# Patient Record
Sex: Female | Born: 1985
Health system: Southern US, Community
[De-identification: ages and names within clinical notes are randomized; demographics above are authoritative.]

---

## 2014-04-03 IMAGING — CR DG FOOT COMPLETE 3+V*R*
3 series · 3 of 3 positions shown · non-contrast
Comparison: None.

CLINICAL DATA: Right foot injury tonight,pulled medical equipment
NG right foot tonight swelling bruising medially and laterally,
initial evaluation

EXAM:
RIGHT FOOT COMPLETE - 3+ VIEW

[x foot ap right]
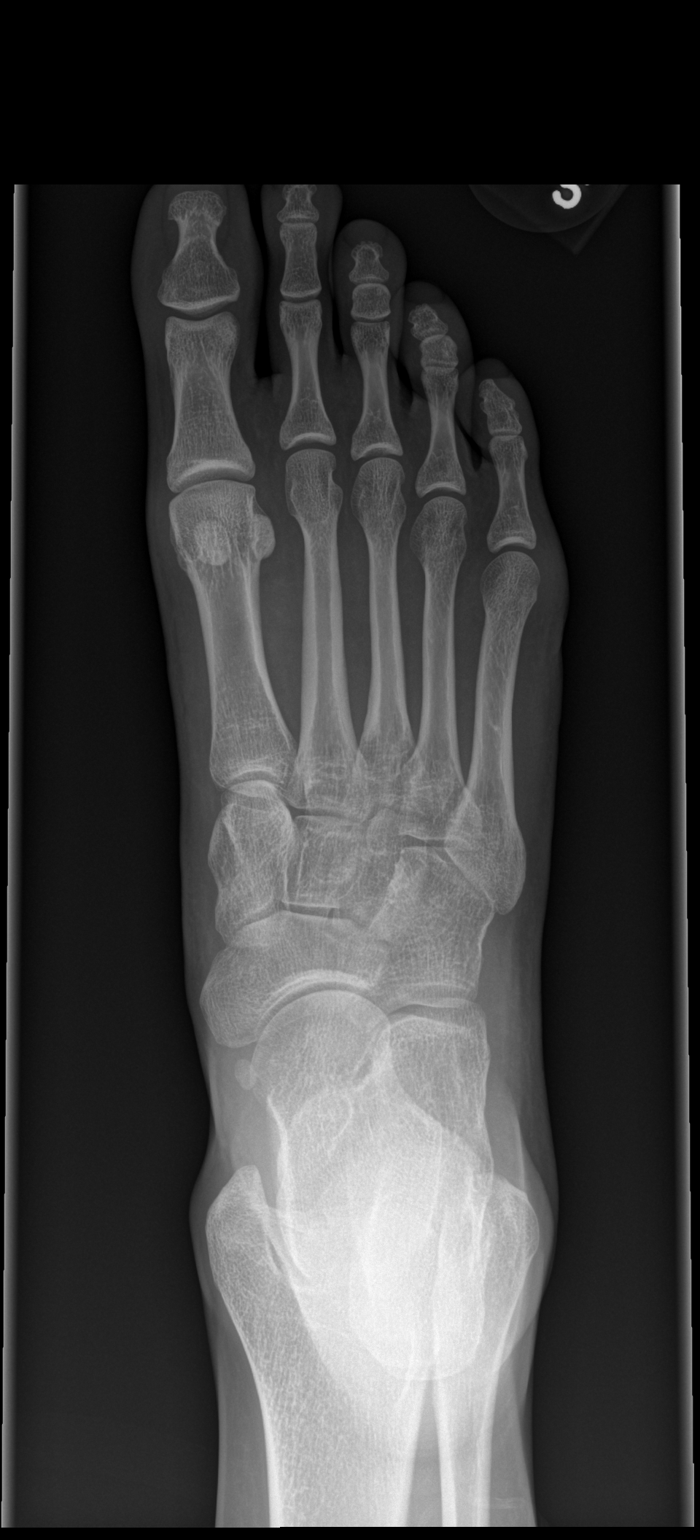

[x foot obl right]
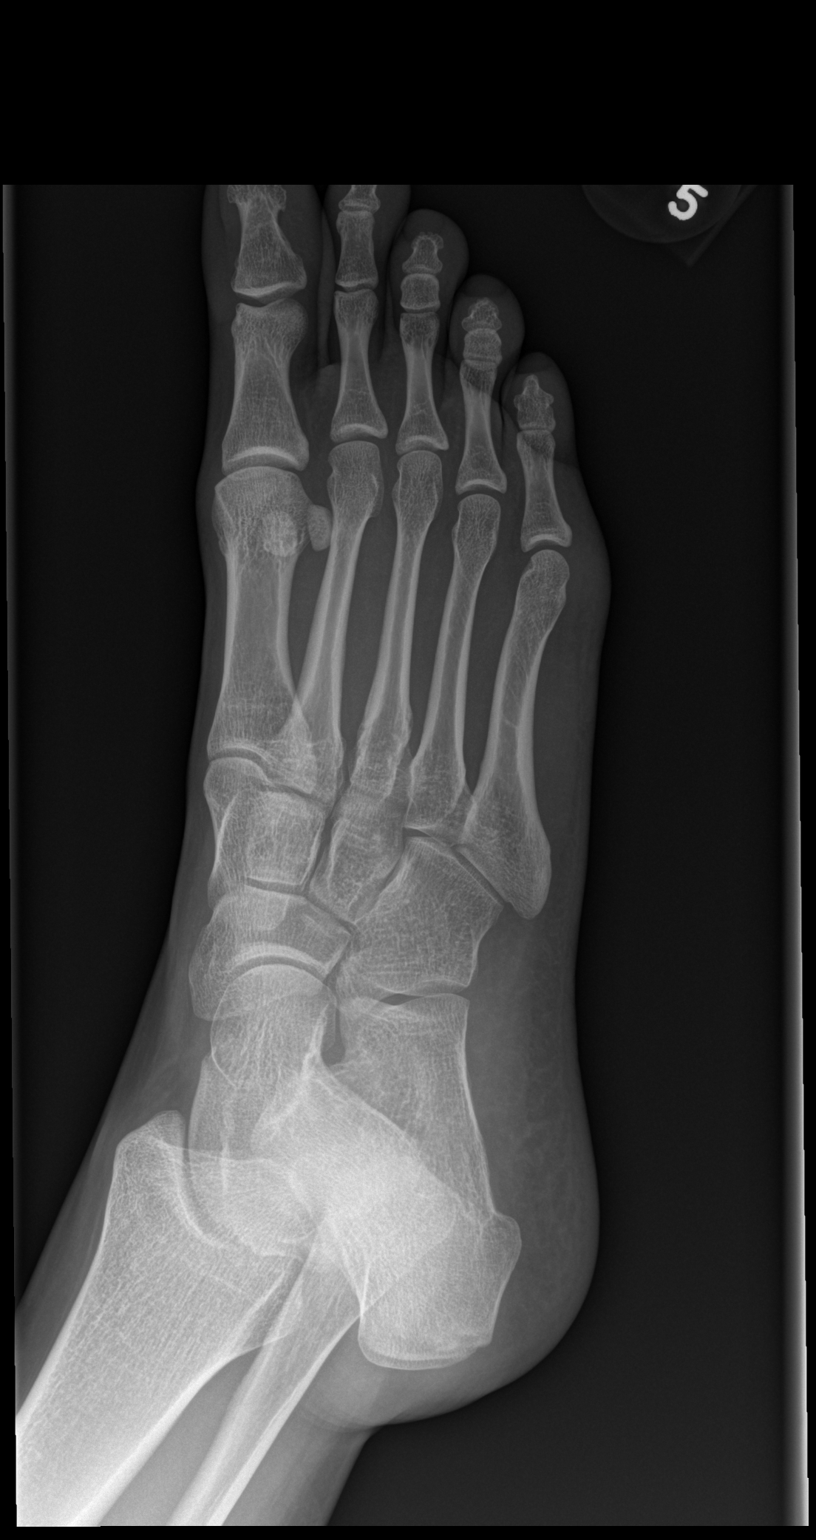

[x foot lat right]
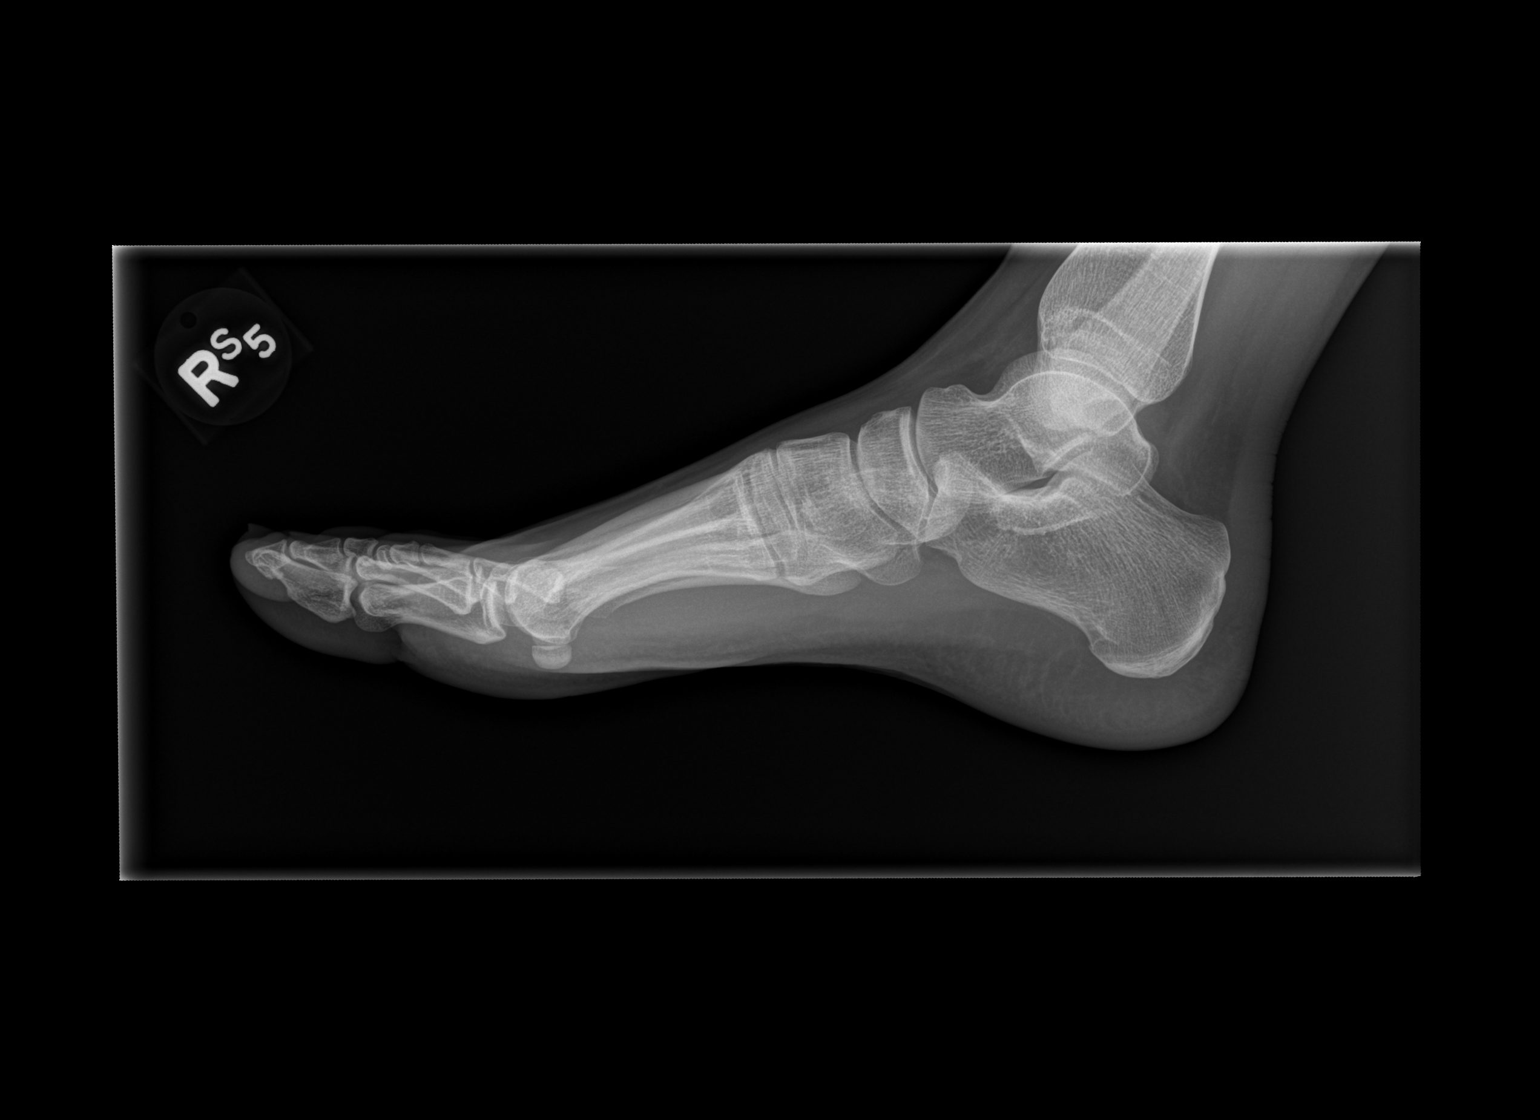

[3 of 3 positions shown; findings below may reference images not displayed]

FINDINGS: There is no evidence of fracture or dislocation. There is no
evidence of arthropathy or other focal bone abnormality. Soft
tissues are unremarkable.
IMPRESSION: Negative.

## 2015-07-05 MED FILL — NORGESTIMATE-ETH ESTRADIOL: 0.18/0.215/ | 28 days supply | Qty: 28 | Fill #6

## 2015-08-04 MED FILL — TRI-PREVIFEM TABLET: 0.18/0.215/ | 28 days supply | Qty: 28 | Fill #0

## 2015-09-05 MED FILL — TRI-PREVIFEM TABLET: 0.18/0.215/ | 28 days supply | Qty: 28 | Fill #1

## 2015-10-05 MED FILL — TRI-PREVIFEM TABLET: 0.18/0.215/ | 28 days supply | Qty: 28 | Fill #2

## 2015-11-08 MED FILL — TRI-PREVIFEM TABLET: 0.18/0.215/ | 28 days supply | Qty: 28 | Fill #0

## 2015-11-21 MED FILL — ESCITALOPRAM 10 MG TABLET: 10 | 30 days supply | Qty: 30 | Fill #0

## 2015-12-12 MED FILL — NORGESTIMATE-ETH ESTRADIOL: 0.18/0.215/ | 28 days supply | Qty: 28 | Fill #1

## 2016-01-09 MED FILL — NORG-EE 0.18-0.215-0.25/0.0: 0.18/0.215/ | 28 days supply | Qty: 28 | Fill #2

## 2016-02-03 MED FILL — NORG-EE 0.18-0.215-0.25/0.0: 0.18/0.215/ | 28 days supply | Qty: 28 | Fill #3

## 2016-03-02 MED FILL — NORG-EE 0.18-0.215-0.25/0.0: 0.18/0.215/ | 28 days supply | Qty: 28 | Fill #4

## 2016-03-08 IMAGING — CR DG CHEST 2V
2 series · 2 of 2 positions shown · non-contrast
Comparison: [DATE]

CLINICAL DATA: 30-year-old female with acute onset chest pain,
shortness of breath. Sensation of unable to take a normal breath on
the left side. No known injury. Initial encounter.

EXAM:
CHEST  2 VIEW

[w chest pa]
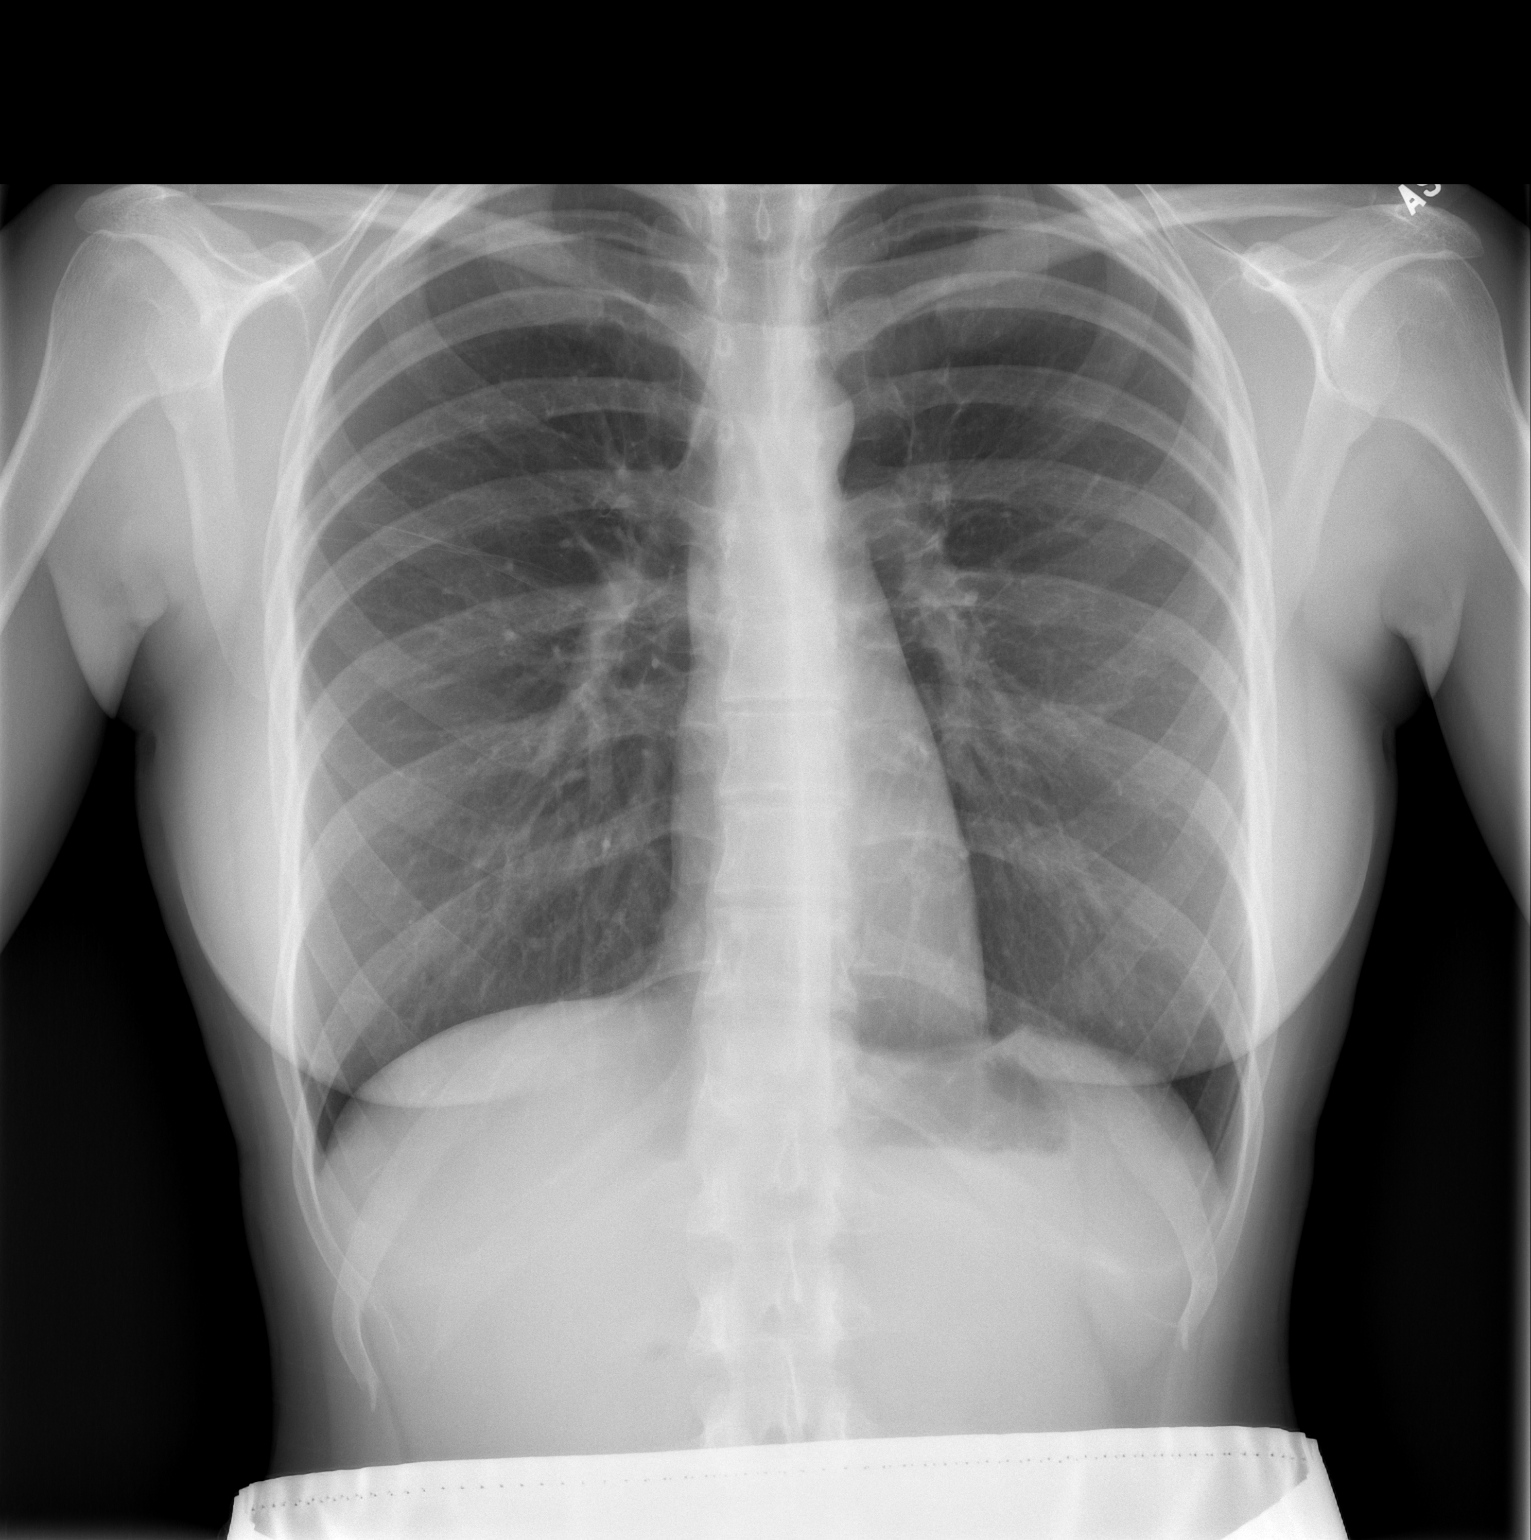

[w chest lat]
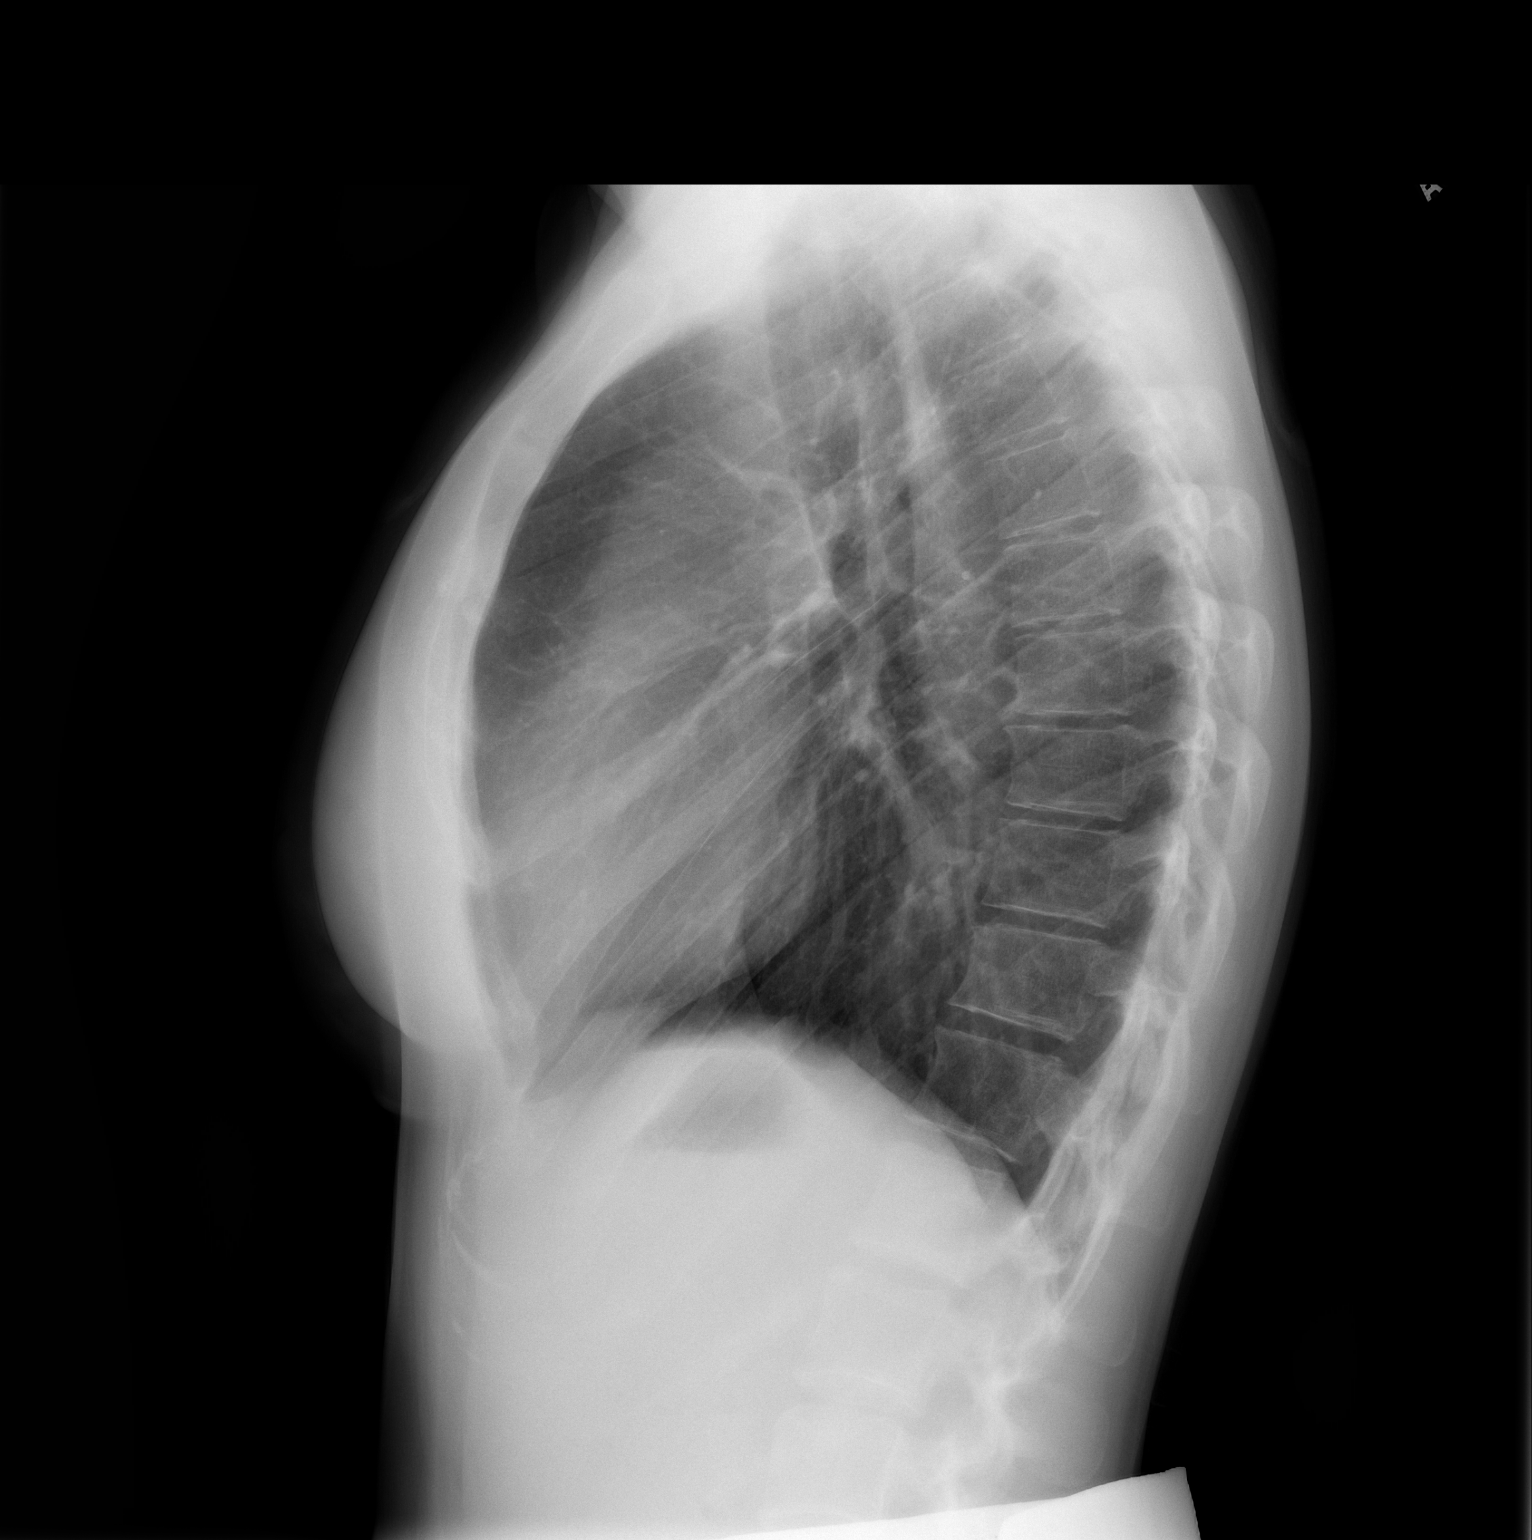

[2 of 2 positions shown; findings below may reference images not displayed]

FINDINGS: Stable lung volumes which are at the upper limits of normal to
mildly increased. Mediastinal contours are stable and within normal
limits. Visualized tracheal air column is within normal limits. No
osseous abnormality identified. Negative visible bowel gas pattern
in the upper abdomen.
IMPRESSION: Stable and negative.  No acute cardiopulmonary abnormality.

## 2016-04-03 MED FILL — NORG-EE 0.18-0.215-0.25/0.0: 0.18/0.215/ | 28 days supply | Qty: 28 | Fill #5

## 2016-05-03 MED FILL — NORG-EE 0.18-0.215-0.25/0.0: 0.18/0.215/ | 28 days supply | Qty: 28 | Fill #6

## 2016-05-28 MED FILL — NORG-EE 0.18-0.215-0.25/0.0: 0.18/0.215/ | 28 days supply | Qty: 28 | Fill #7

## 2016-07-06 MED FILL — NORG-EE 0.18-0.215-0.25/0.0: 0.18/0.215/ | 28 days supply | Qty: 28 | Fill #8

## 2016-08-07 MED FILL — NORG-EE 0.18-0.215-0.25/0.0: 0.18/0.215/ | 28 days supply | Qty: 28 | Fill #9

## 2016-09-12 MED FILL — NORG-EE 0.18-0.215-0.25/0.0: 0.18/0.215/ | 28 days supply | Qty: 28 | Fill #10

## 2016-10-22 MED FILL — NORG-EE 0.18-0.215-0.25/0.0: 0.18/0.215/ | 28 days supply | Qty: 28 | Fill #11

## 2019-07-16 IMAGING — MR MR HEAD WO/W CM
12 series · 48 of 48 positions shown · IV contrast (gadavist)
Comparison: Brain MRI [DATE].

CLINICAL DATA: 33-year-old female with abnormal finding on brain
MRI last [REDACTED]. Hand dysesthesia. Intermittent right hand pain and
numbness. Query progression of nonspecific white matter lesions.

EXAM:
MRI HEAD WITHOUT AND WITH CONTRAST
TECHNIQUE: Multiplanar, multiecho pulse sequences of the brain and surrounding
structures were obtained without and with intravenous contrast.
CONTRAST:  6mL GADAVIST GADOBUTROL 1 MMOL/ML IV SOLN

[Series 5: T1 · sagittal · 4.0mm · 0.75mm/px · 3 of 29 slices shown (1 of 2)]
[im 1/29]
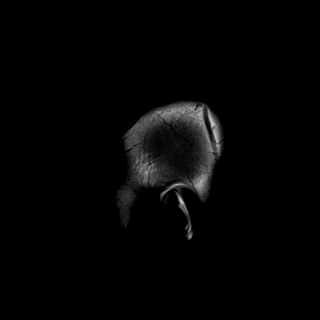
[im 15/29]
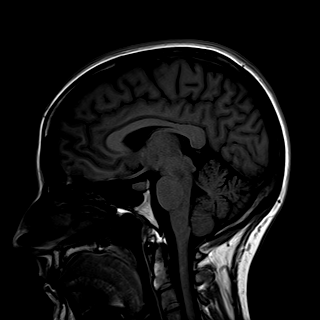
[im 29/29]
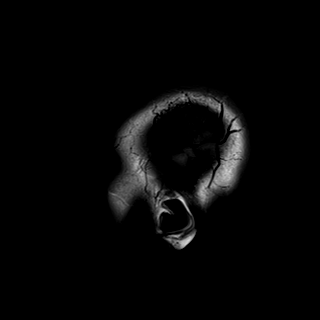

[Series 6: DWI · axial · 3.0mm · 1.44mm/px · z∈[-82,+67]mm · 7 of 92 slices shown (1 of 4)]
[im 1/92]
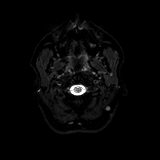
[im 16/92]
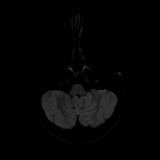
[im 31/92]
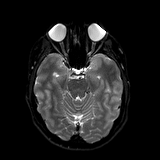
[im 46/92]
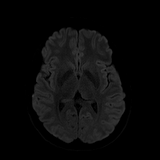
[im 61/92]
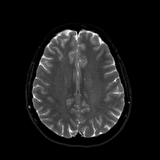
[im 76/92]
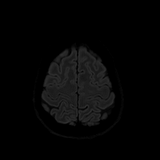
[im 92/92]
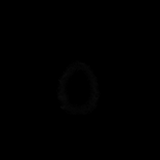

[Series 7: DWI · axial · 3.0mm · 1.44mm/px · z∈[-82,+67]mm · 3 of 45 slices shown (2 of 4)]
[im 1/45]
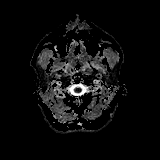
[im 23/45]
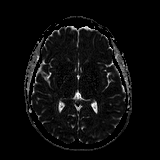
[im 45/45]
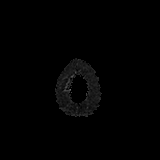

[Series 8: DWI · coronal · 5.0mm · 1.44mm/px · 5 of 64 slices shown (3 of 4)]
[im 1/64]
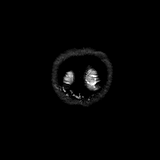
[im 16/64]
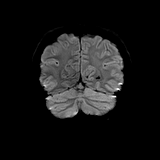
[im 32/64]
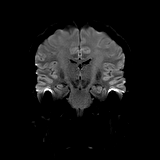
[im 48/64]
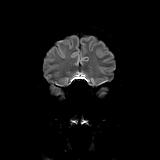
[im 64/64]
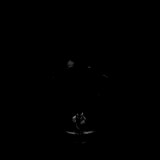

[Series 9: DWI · coronal · 5.0mm · 1.44mm/px · 2 of 32 slices shown (4 of 4)]
[im 1/32]
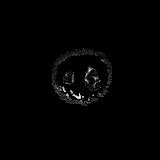
[im 32/32]
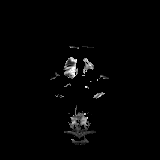

[Series 10: T2 · axial · 4.0mm · 0.36mm/px · z∈[-88,+68]mm · 2 of 31 slices shown]
[im 1/31]
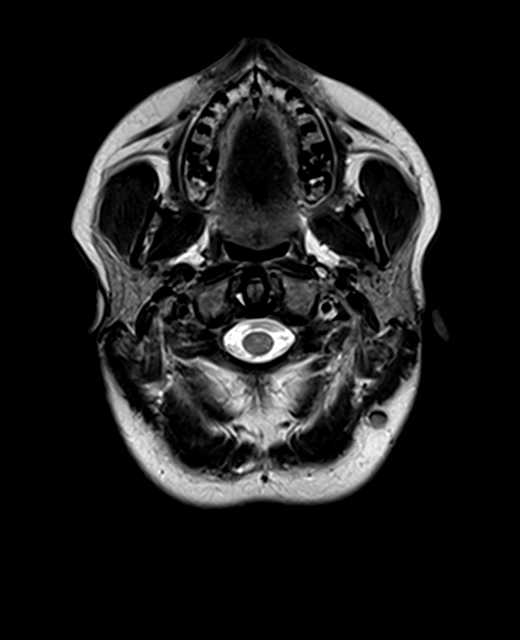
[im 31/31]
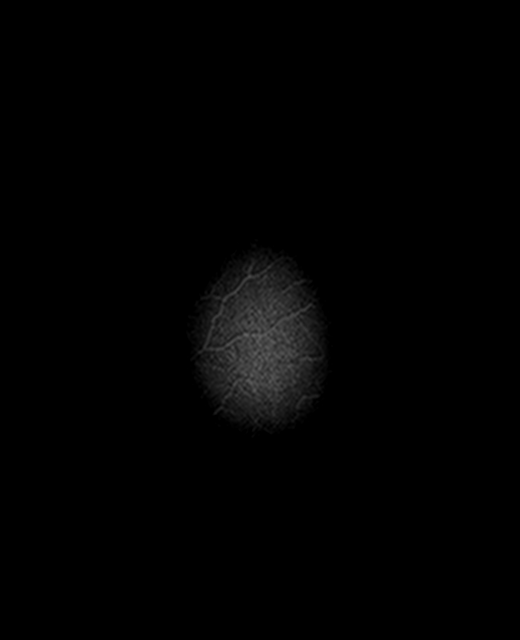

[Series 11: FLAIR · axial · 3.0mm · 0.72mm/px · z∈[-85,+65]mm · 2 of 26 slices shown (1 of 2)]
[im 1/26]
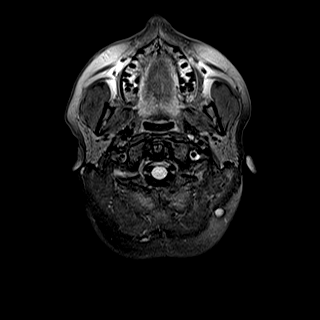
[im 26/26]
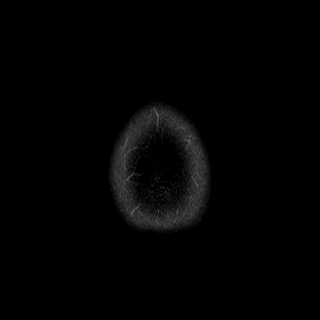

[Series 13: swi_images · axial · 1.5mm · 0.90mm/px · z∈[-81,+61]mm · 7 of 96 slices shown]
[im 1/96]
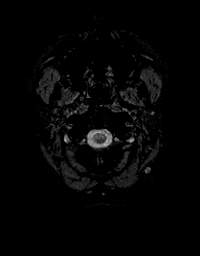
[im 16/96]
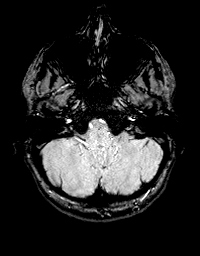
[im 32/96]
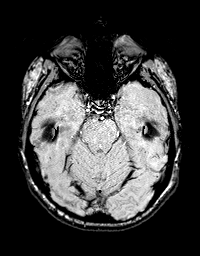
[im 48/96]
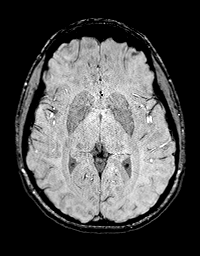
[im 64/96]
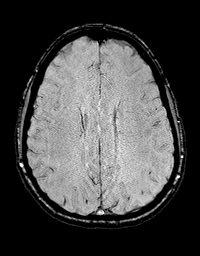
[im 80/96]
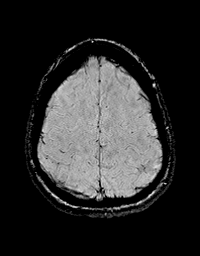
[im 96/96]
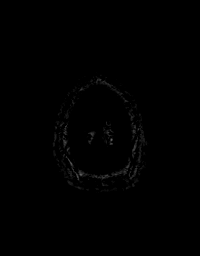

[Series 14: T1 · axial · 1.0mm · 0.94mm/px · z∈[-100,+58]mm · 11 of 160 slices shown (2 of 2)]
[im 1/160]
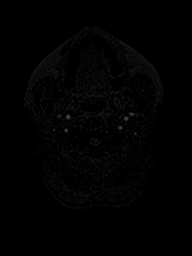
[im 16/160]
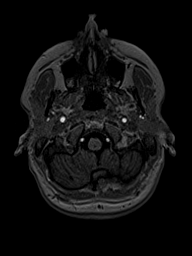
[im 32/160]
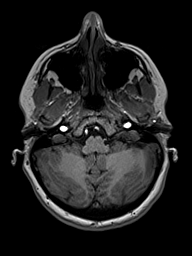
[im 48/160]
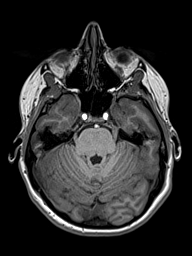
[im 64/160]
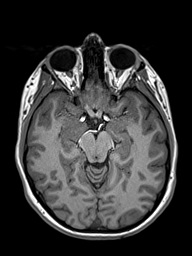
[im 80/160]
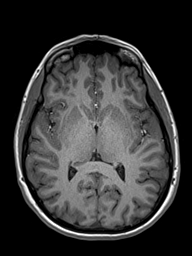
[im 96/160]
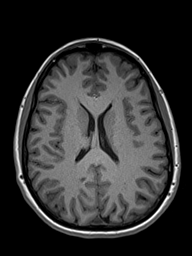
[im 112/160]
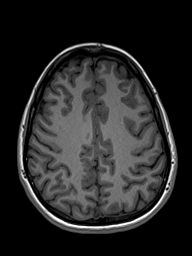
[im 128/160]
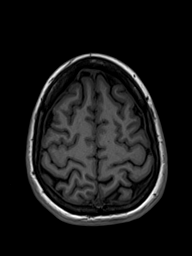
[im 144/160]
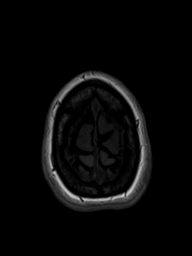
[im 160/160]
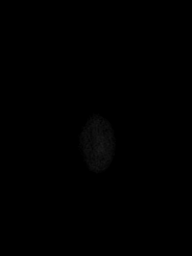

[Series 15: FLAIR · sagittal · 4.0mm · 0.72mm/px · 2 of 29 slices shown (2 of 2)]
[im 1/29]
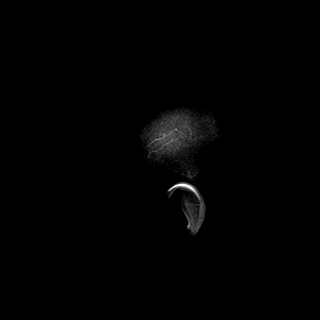
[im 29/29]
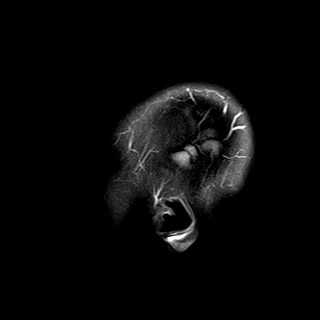

[Series 16: T2 post-contrast · coronal · 4.0mm · 0.36mm/px · 2 of 34 slices shown]
[im 1/34]
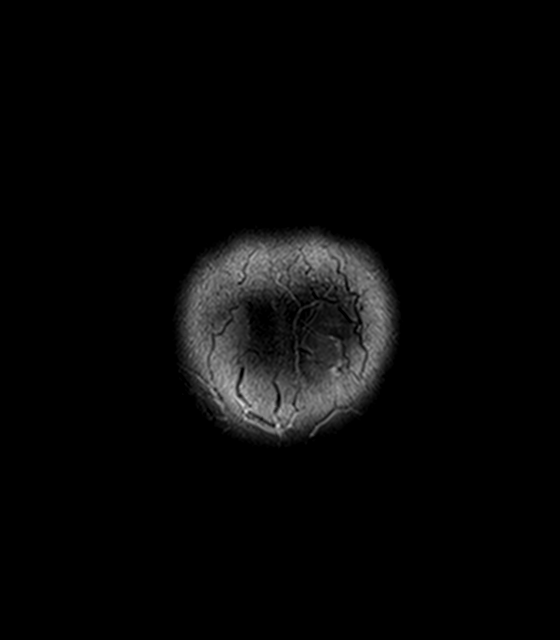
[im 34/34]
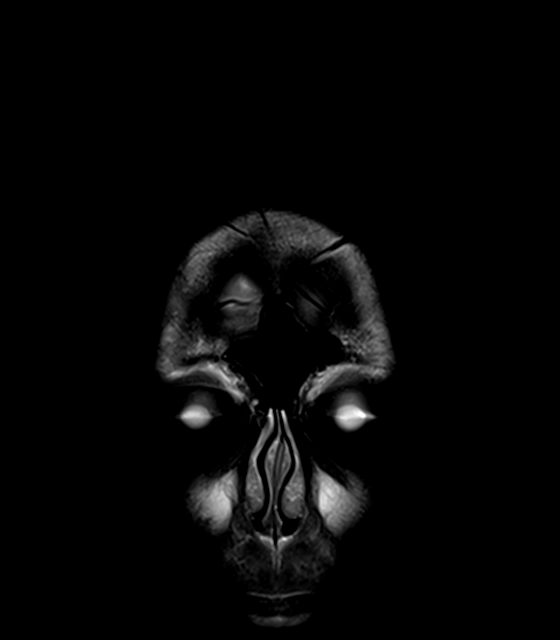

[Series 18: T1 post-contrast · coronal · 4.0mm · 0.72mm/px · 2 of 34 slices shown]
[im 1/34]
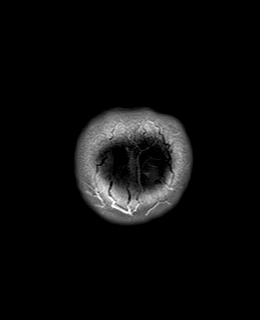
[im 34/34]
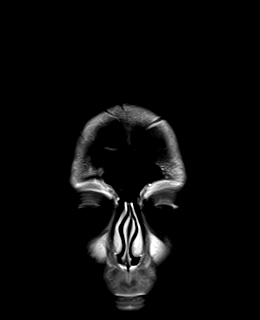

[48 of 48 positions shown; findings below may reference images not displayed]

FINDINGS: Brain: Normal cerebral volume. No restricted diffusion to suggest
acute infarction. No midline shift, mass effect, evidence of mass
lesion, ventriculomegaly, extra-axial collection or acute
intracranial hemorrhage. Cervicomedullary junction and pituitary are
within normal limits.

Scattered small periatrial white matter T2 and FLAIR hyperintense
foci, that on series 11, image 15 today was not clearly identified
on the prior axial FLAIR, but comparison of sagittal FLAIR images
suggest this is stable. Small right frontal horn lesion as well as 1
of the smaller left periatrial lesions appear oriented perpendicular
to the ventricles (series 15, image 13 today), and stable. There is
a small anterior right temporal lobe periventricular lesion which
appears mildly increased on series 11, image 10. But no definite
progression of the other lesions.

The corpus callosum is spared. No cortical involvement. Deep gray
nuclei, and brainstem appear within normal limits. There is
questionable stable left cerebellar involvement on series 10, image
8.

No diffusion restricted or enhancing lesions. No chronic cerebral
blood products. No dural thickening.

Vascular: Major intracranial vascular flow voids are stable and
appear normal. The major dural venous sinuses are enhancing and
appear to be patent.

Skull and upper cervical spine: Negative visible cervical spine.
Visualized bone marrow signal is within normal limits.

Sinuses/Orbits: Orbits soft tissues appears symmetric and normal.
Paranasal sinuses and mastoids remain clear.

Other: Visible internal auditory structures appear normal. Scalp and
face soft tissues appear negative.
IMPRESSION: 1. Scattered nonspecific cerebral white matter lesions
re-demonstrated. Questionable progression of a right anterior
temporal horn lesions since [DATE], image 10 today), but no
abnormal diffusion or enhancement.
No new lesions, and no definite progression of the other lesions.
2. No new intracranial abnormality.

## 2019-10-23 IMAGING — MG DIGITAL SCREENING BILAT W/ TOMO W/ CAD
8 series · 9 of 24 positions shown · non-contrast
Comparison: None.

CLINICAL DATA: Screening.

EXAM:
DIGITAL SCREENING BILATERAL MAMMOGRAM WITH TOMO AND CAD

[L MLO synth-2D]
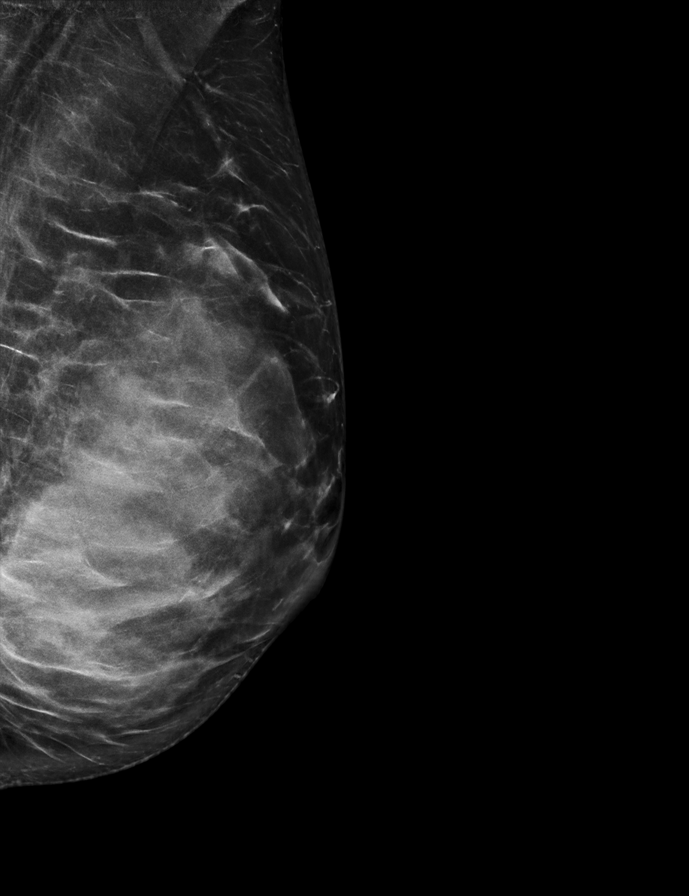

[R CC synth-2D]
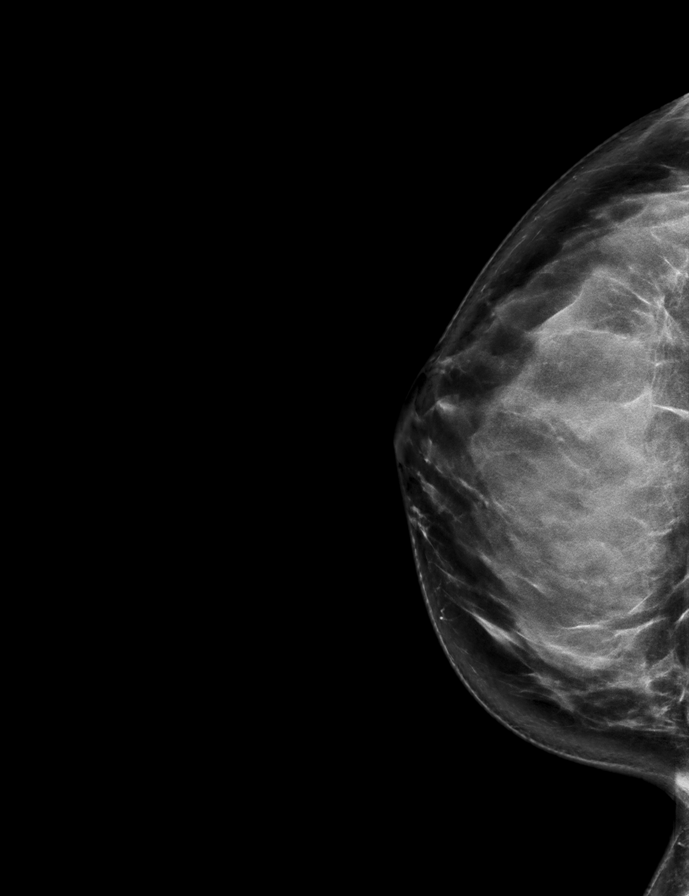

[L CC synth-2D]
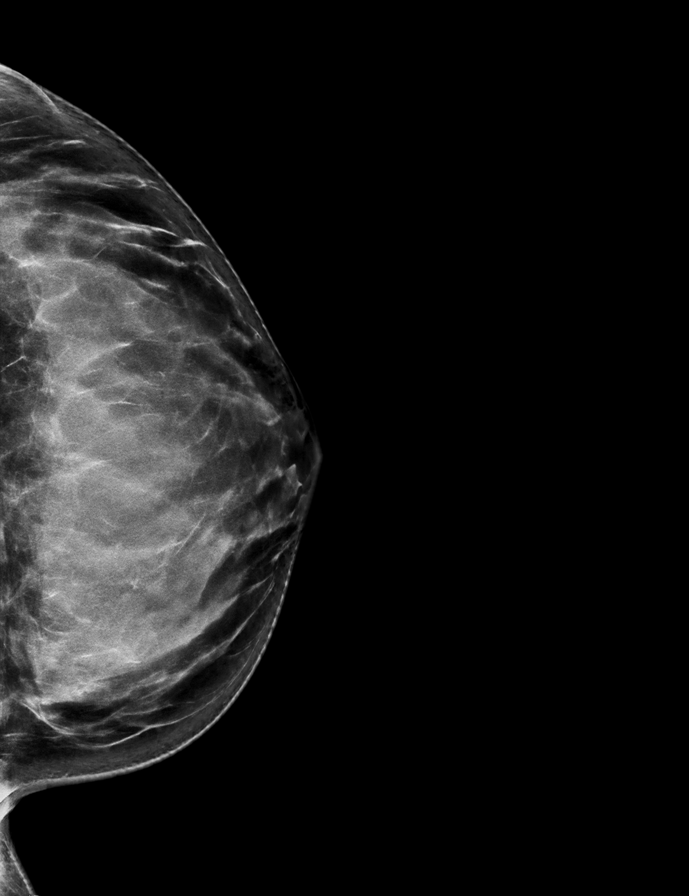

[R MLO synth-2D]
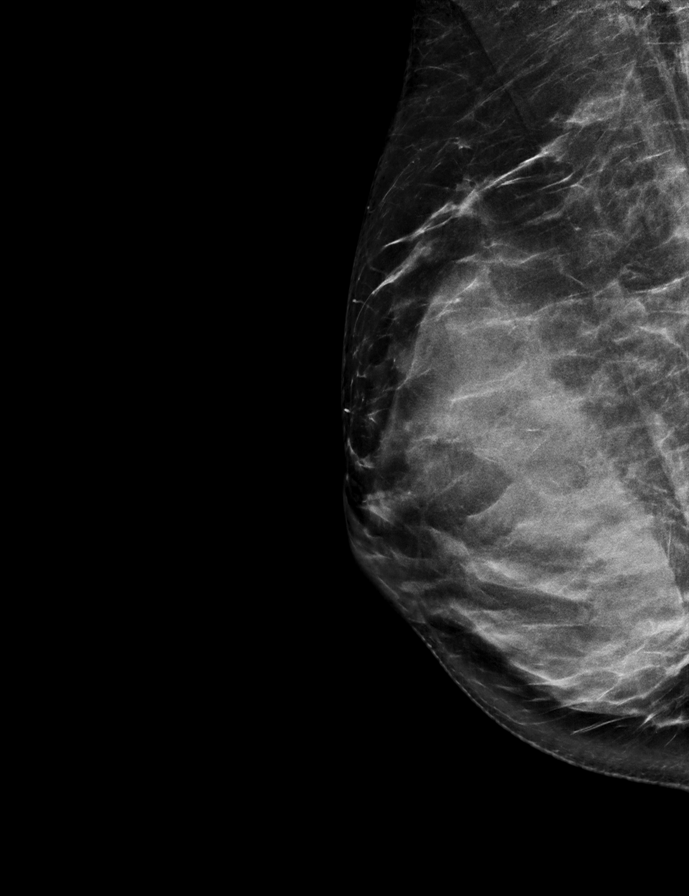

[R CC tomo · 2 of 75 frames shown]
[frame 25/75]
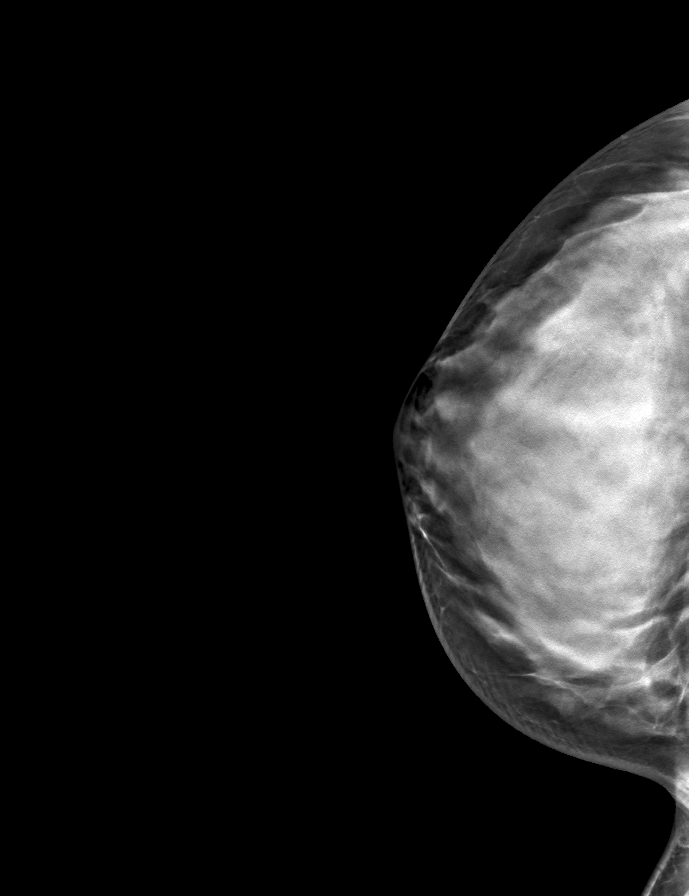
[frame 38/75]
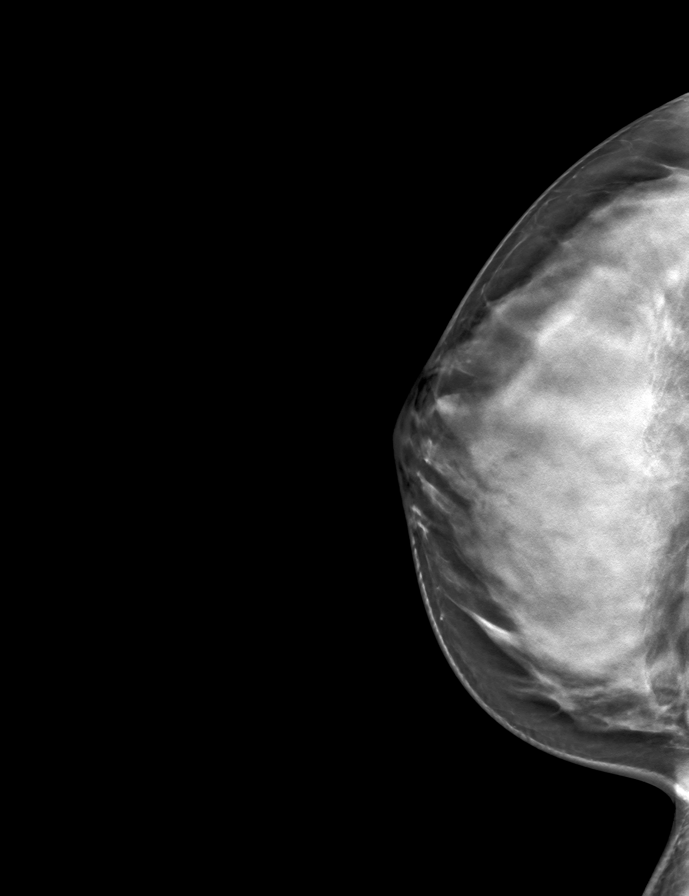

[L CC tomo · tomo slice 36/71.0]
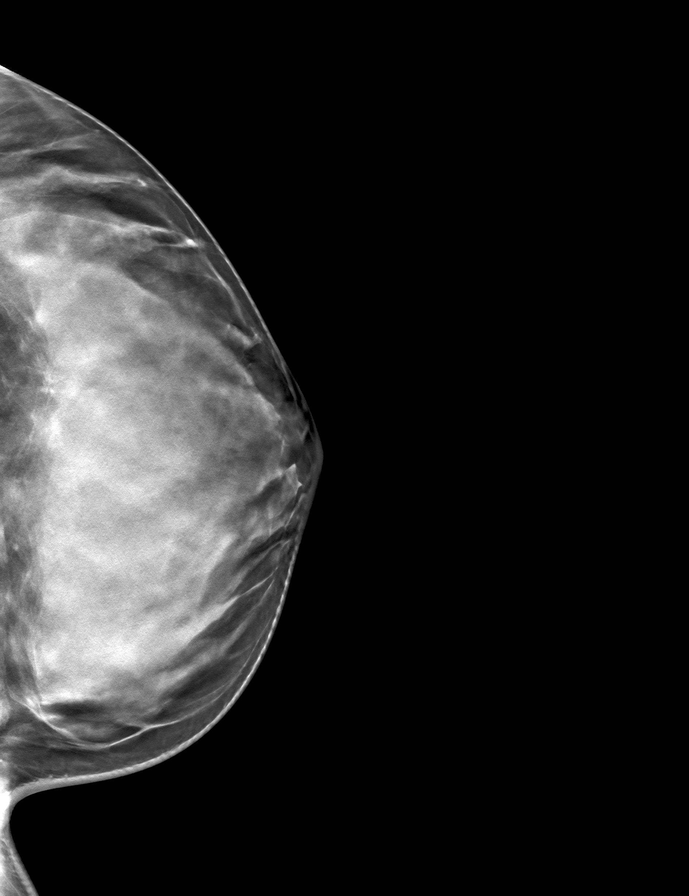

[R MLO tomo · tomo slice 36/71.0]
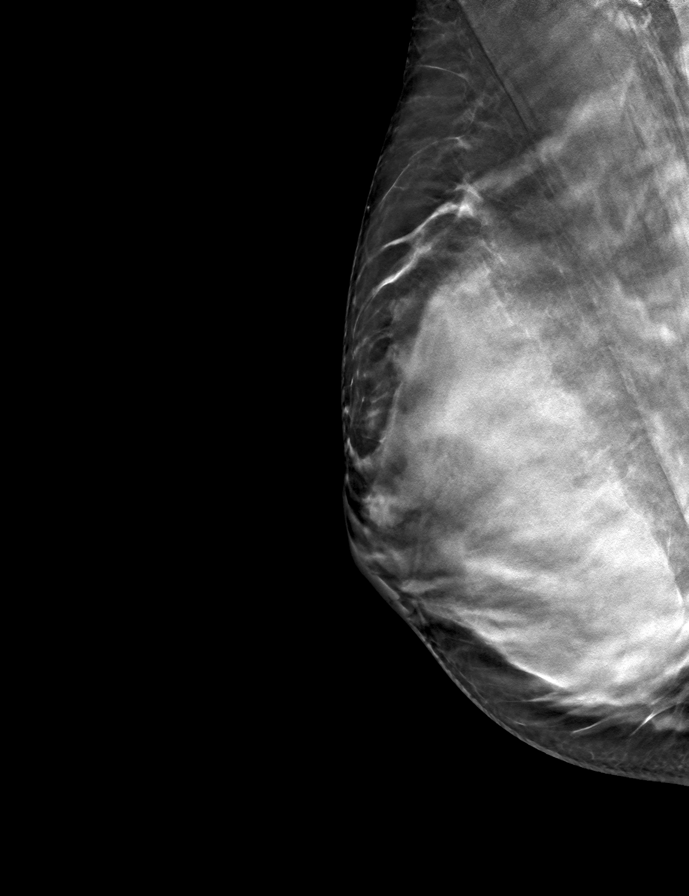

[L MLO tomo · tomo slice 36/71.0]
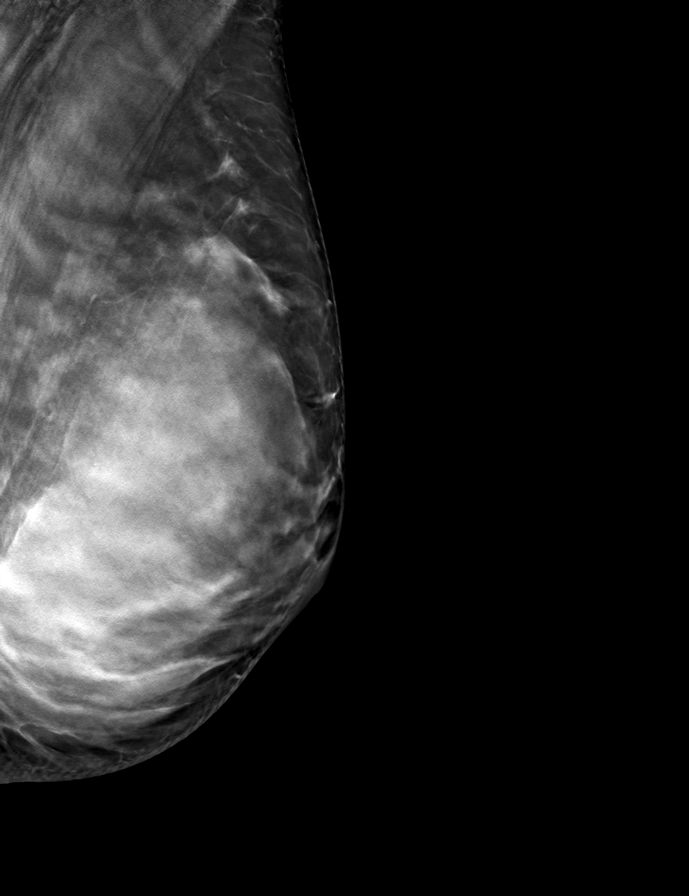

[9 of 24 positions shown; findings below may reference images not displayed]

ACR Breast Density Category d: The breast tissue is extremely dense,
which lowers the sensitivity of mammography.
FINDINGS: There are no findings suspicious for malignancy. Images were
processed with CAD.
IMPRESSION: No mammographic evidence of malignancy. A result letter of this
screening mammogram will be mailed directly to the patient.

RECOMMENDATION:
Screening mammogram at age 42. (Code:[PG])

BI-RADS CATEGORY  1: Negative.

## 2019-10-23 IMAGING — MG DIGITAL SCREENING BILAT W/ TOMO W/ CAD
8 series · 9 of 24 positions shown · non-contrast
Comparison: None.

CLINICAL DATA: Screening.

EXAM:
DIGITAL SCREENING BILATERAL MAMMOGRAM WITH TOMO AND CAD

[R MLO synth-2D]
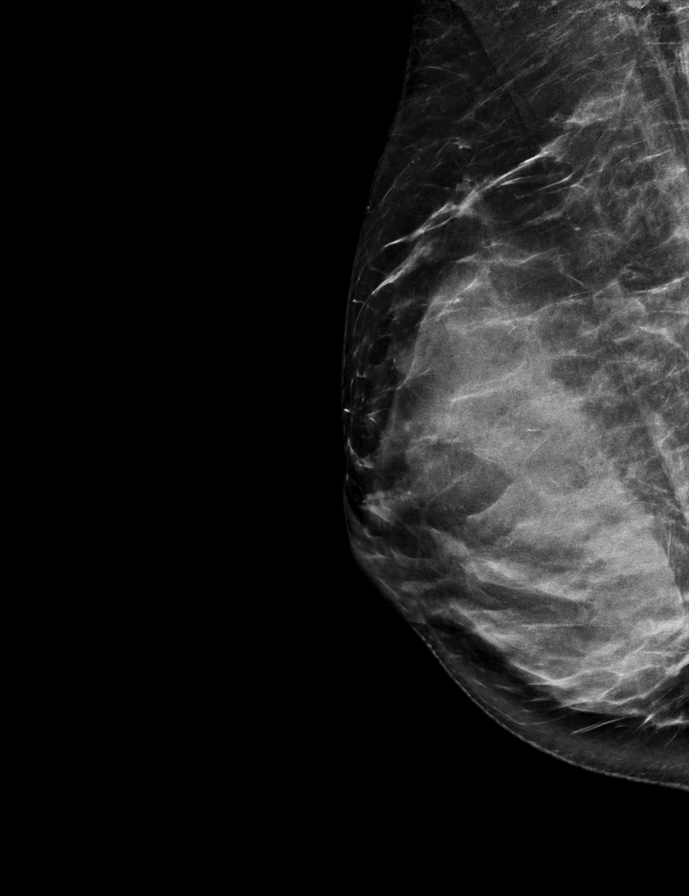

[L CC synth-2D]
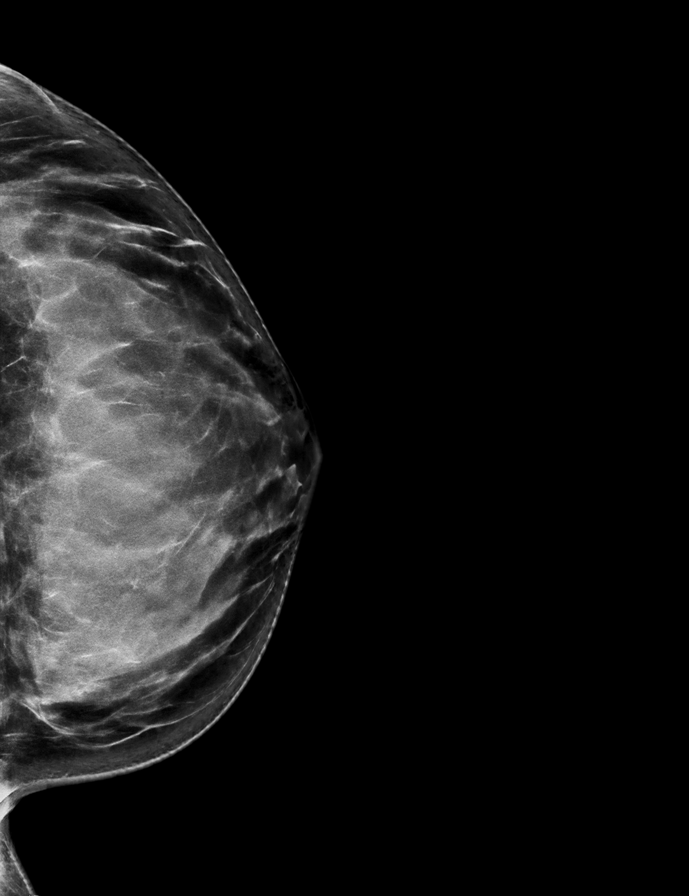

[L MLO synth-2D]
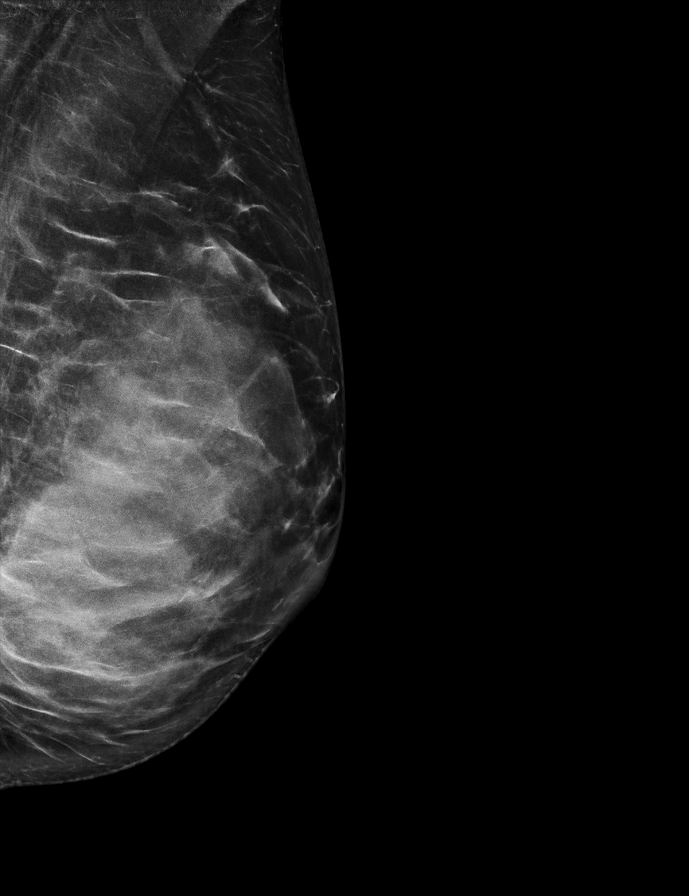

[R CC synth-2D]
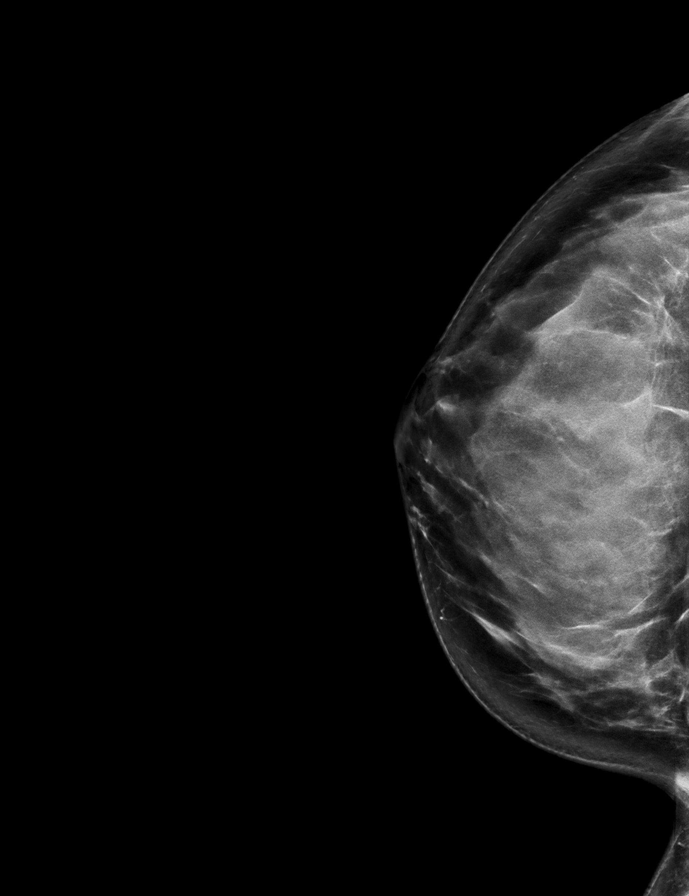

[R CC tomo · 2 of 75 frames shown]
[frame 25/75]
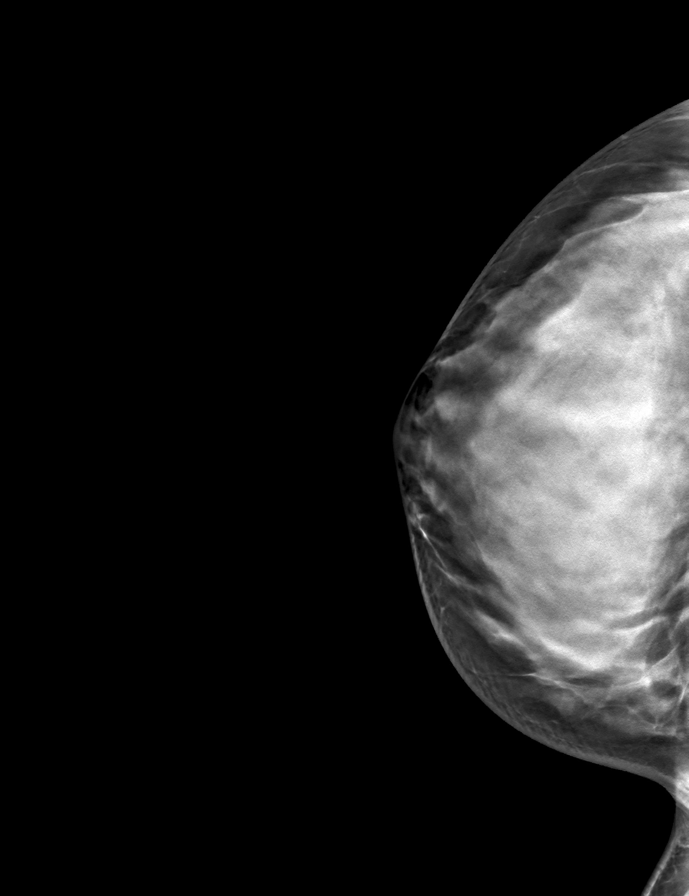
[frame 38/75]
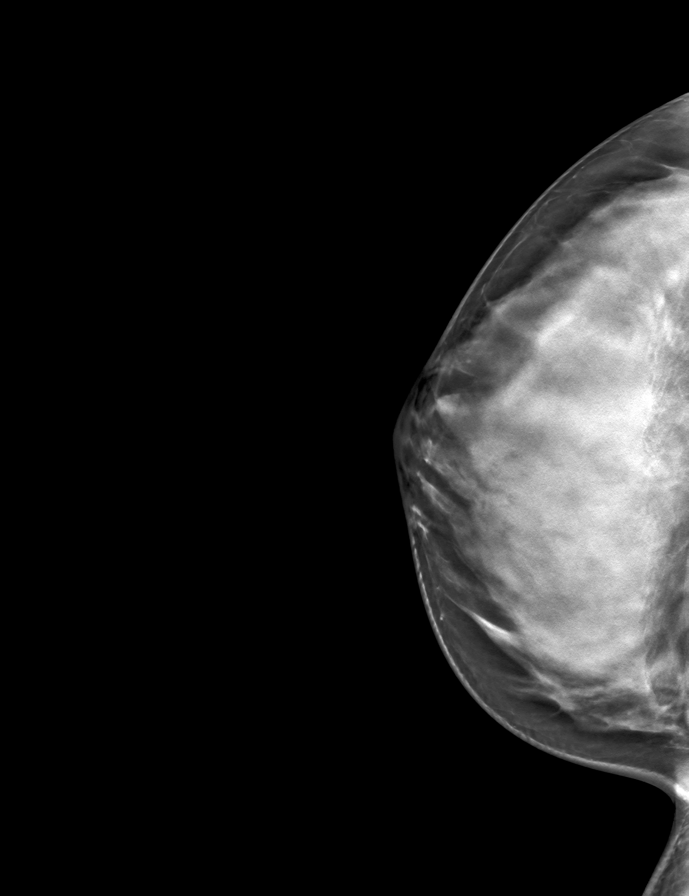

[L CC tomo · tomo slice 36/71.0]
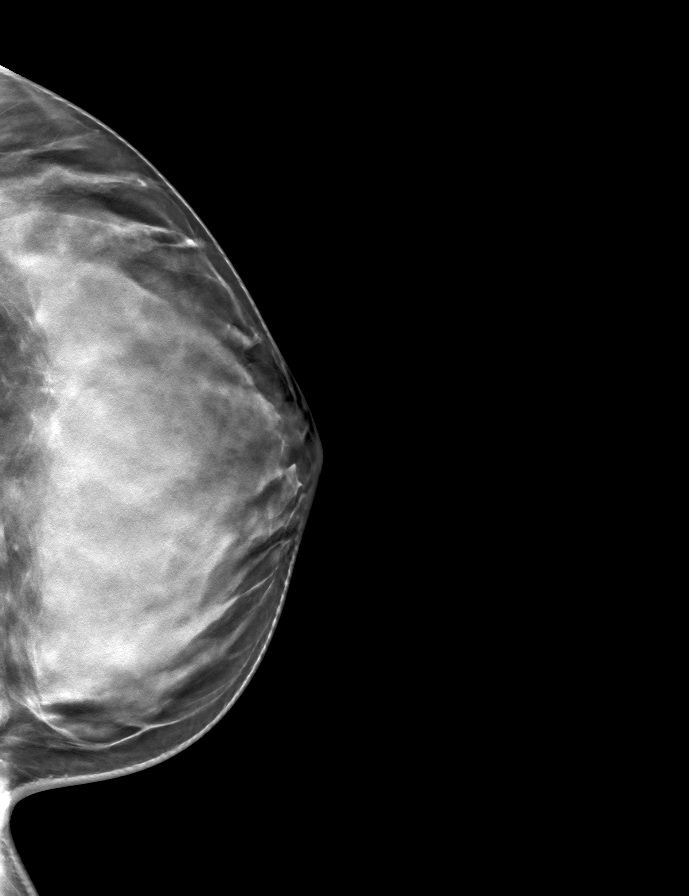

[L MLO tomo · tomo slice 36/71.0]
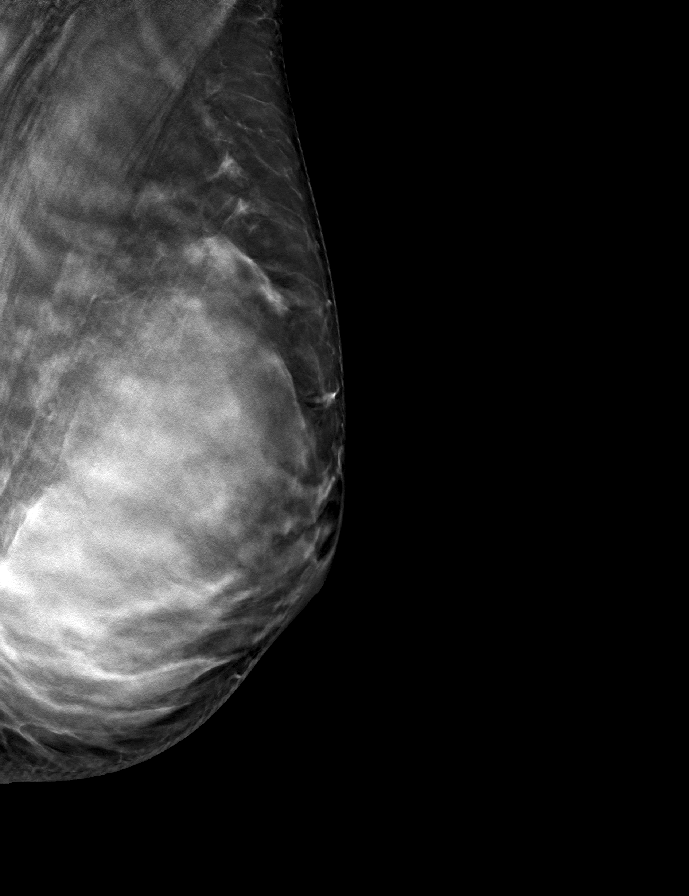

[R MLO tomo · tomo slice 36/71.0]
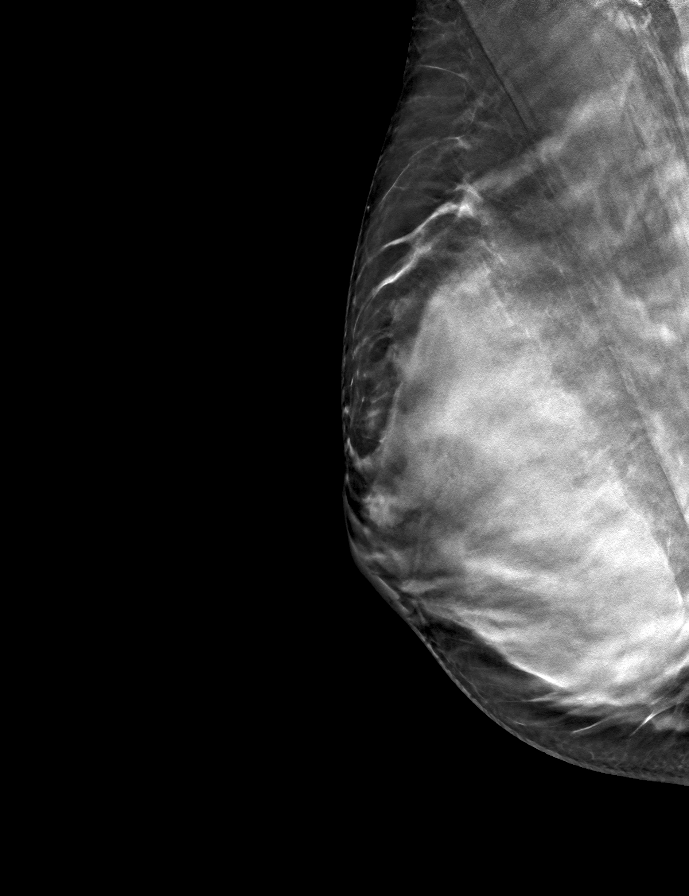

[9 of 24 positions shown; findings below may reference images not displayed]

ACR Breast Density Category d: The breast tissue is extremely dense,
which lowers the sensitivity of mammography.
FINDINGS: There are no findings suspicious for malignancy. Images were
processed with CAD.
IMPRESSION: No mammographic evidence of malignancy. A result letter of this
screening mammogram will be mailed directly to the patient.

RECOMMENDATION:
Screening mammogram at age 42. (Code:[PG])

BI-RADS CATEGORY  1: Negative.

## 2020-06-23 IMAGING — XA DG SPINAL PUNCT LUMBAR DIAG WITH FL CT GUIDANCE
1 series · 1 of 1 positions shown · non-contrast
Comparison: none

CLINICAL DATA: White matter findings by MRI.

[Series 1: ortho standard · 1 of 1 slices shown]
[im 1/1]
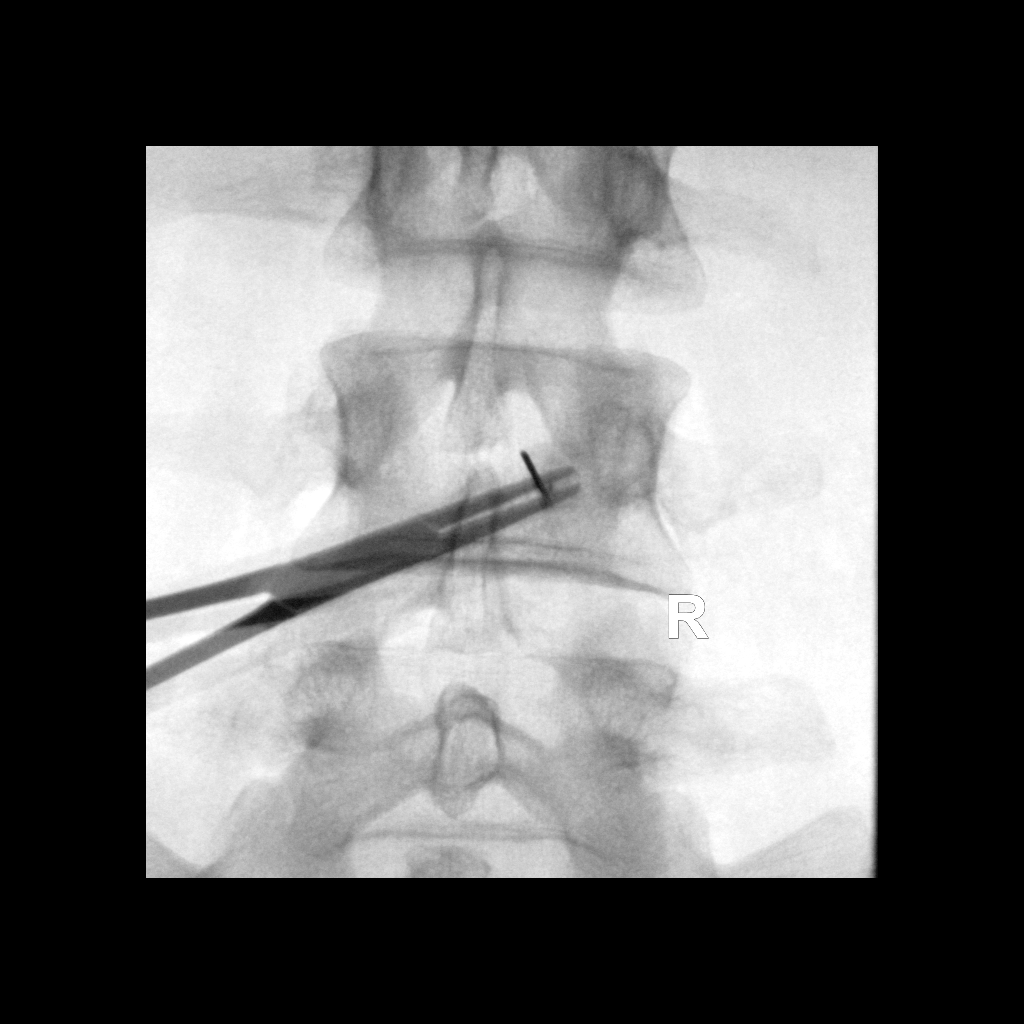

[1 of 1 positions shown; findings below may reference images not displayed]

EXAM:
DIAGNOSTIC LUMBAR PUNCTURE UNDER FLUOROSCOPIC GUIDANCE

FLUOROSCOPY TIME:  Fluoroscopy Time: 0 minutes 10 seconds.
micro gray meter squared

PROCEDURE:
Informed consent was obtained from the patient prior to the
procedure, including potential complications of headache, allergy,
and pain. With the patient prone, the lower back was prepped with
Betadine. 1% Lidocaine was used for local anesthesia. Lumbar
puncture was performed at the right L3-4 level using a 20 gauge
needle with return of clear CSF with an opening pressure visibly in
the normal range. Seven ml of CSF were obtained for laboratory
studies. The patient tolerated the procedure well and there were no
apparent complications.
IMPRESSION: Lumbar puncture on the right at L3-4.

## 2020-12-08 IMAGING — MR MR BREAST BILAT WO/W CM
8 of 12 series · 29 of 48 positions shown · IV contrast (7 ml gadavist)
Comparison: None.

CLINICAL DATA: High risk screening. Strong family history of breast
cancer in the patient's mother and sister at age 36. No current
problems.

LABS:  None.
EXAM:
BILATERAL BREAST MRI WITH AND WITHOUT CONTRAST
TECHNIQUE: Multiplanar, multisequence MR images of both breasts were obtained
prior to and following the intravenous administration of 7 ml of
Gadavist

[Series 2: t2_tirm_tra ipat (a-p) · axial · 3.0mm · 0.66mm/px · 1 of 68 slices shown]
[im 1/68]
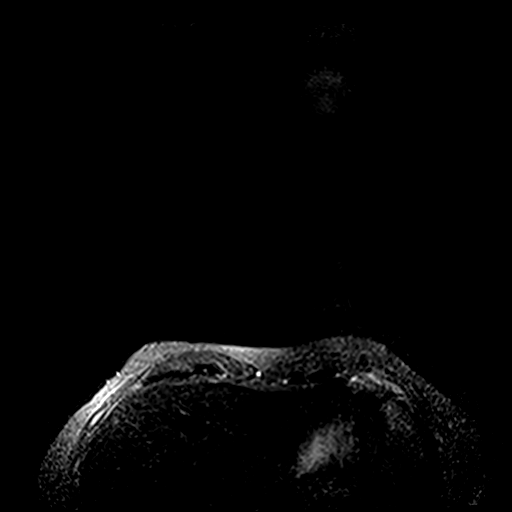

[Series 3: fl3d pre-cm no · axial · non-contrast · 0.9mm · 0.89mm/px · z∈[-97,+89]mm · 5 of 208 slices shown]
[im 1/208]
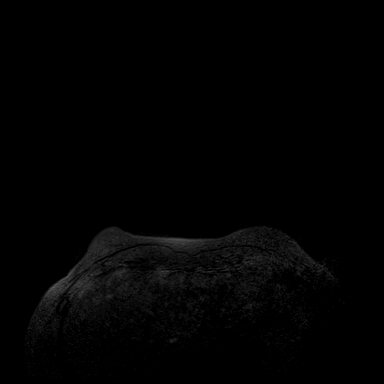
[im 52/208]
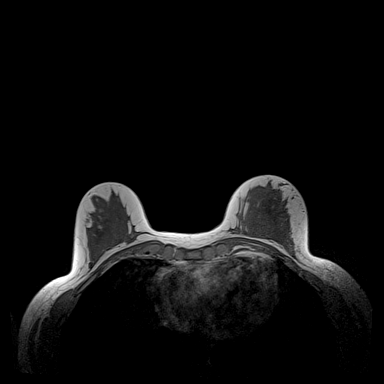
[im 104/208]
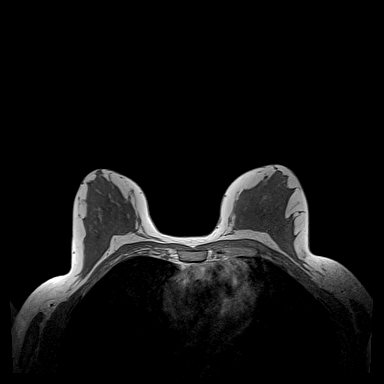
[im 156/208]
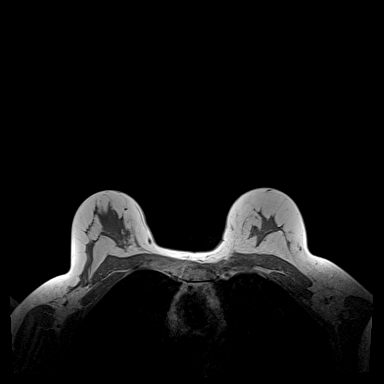
[im 208/208]
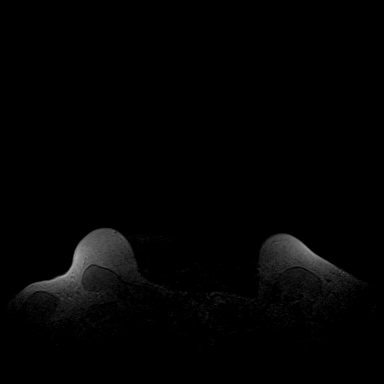

[Series 4: fl3d pre-cm · axial · non-contrast · 0.9mm · 0.82mm/px · z∈[-97,+89]mm · 5 of 208 slices shown]
[im 1/208]
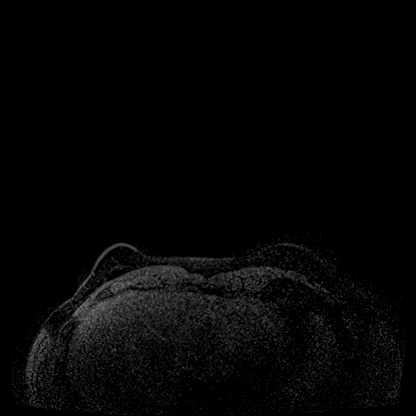
[im 52/208]
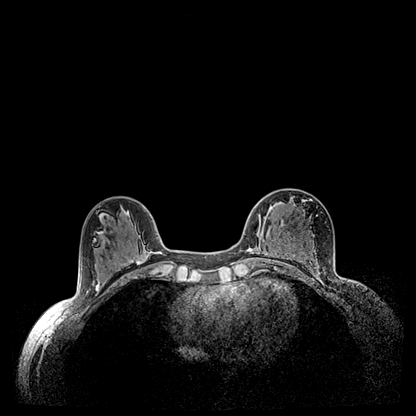
[im 104/208]
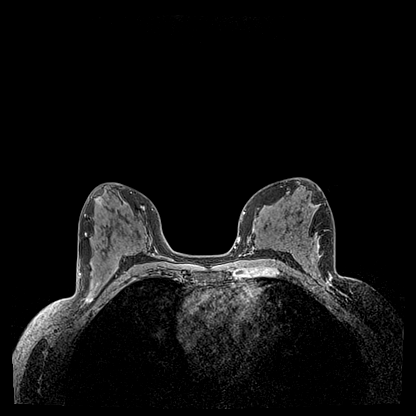
[im 156/208]
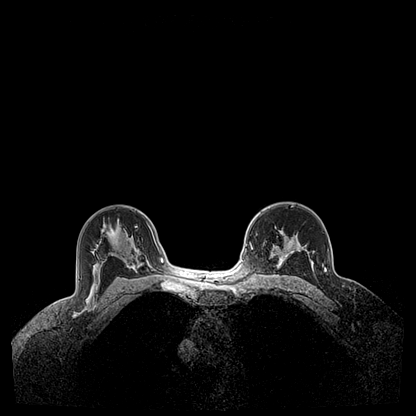
[im 208/208]
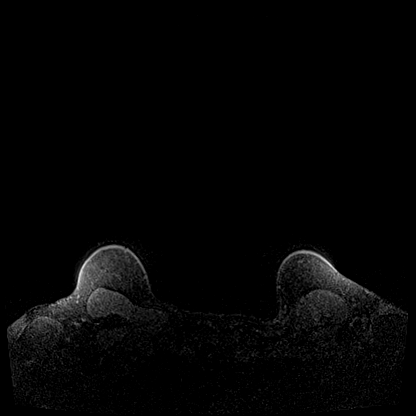

[Series 5: fl3d post-cm 20 · axial · 0.9mm · 0.82mm/px · z∈[-97,+89]mm · 5 of 208 slices shown (1 of 3)]
[im 1/208]
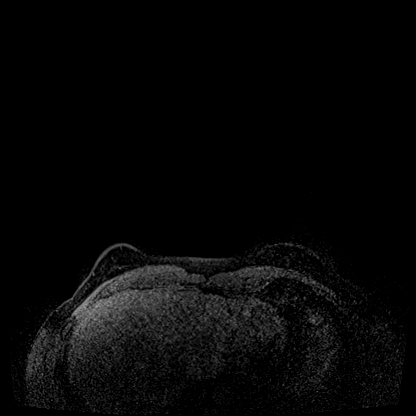
[im 52/208]
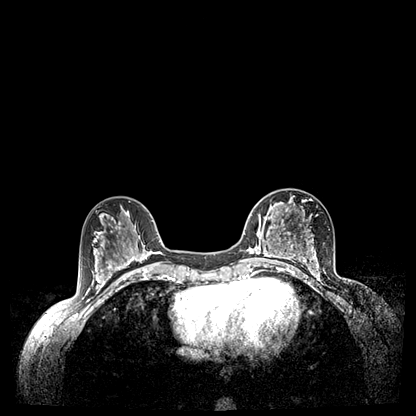
[im 104/208]
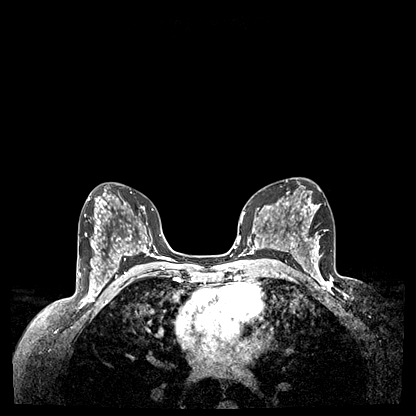
[im 156/208]
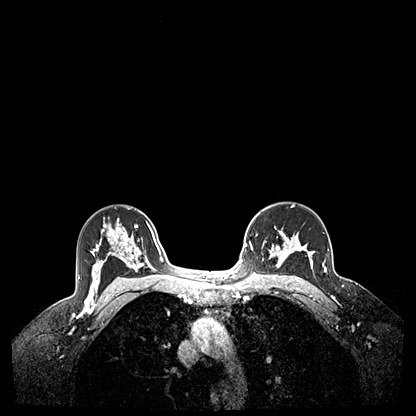
[im 208/208]
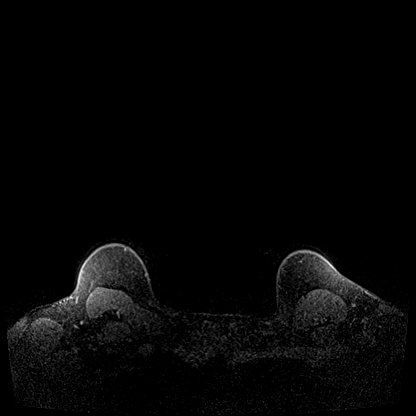

[Series 6: fl3d post-cm 20 · axial · 0.9mm · 0.82mm/px · z∈[-97,+89]mm · 5 of 208 slices shown (2 of 3)]
[im 1/208]
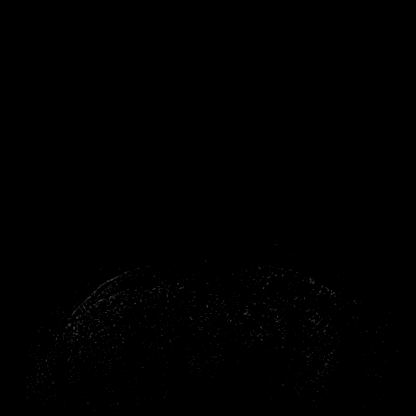
[im 52/208]
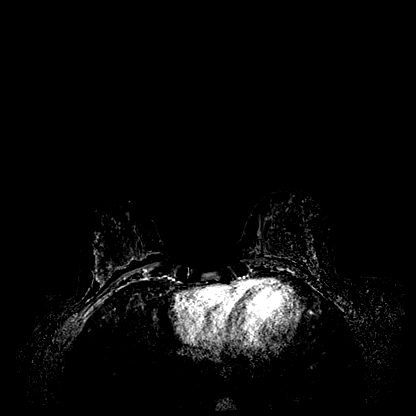
[im 104/208]
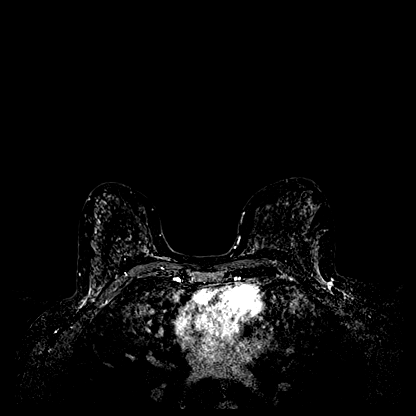
[im 156/208]
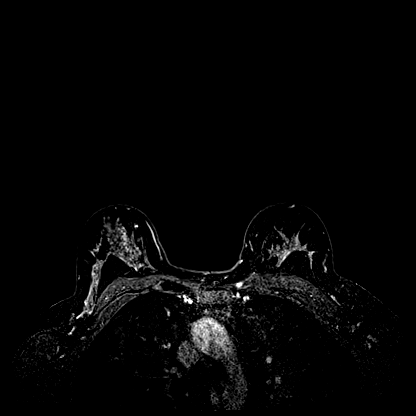
[im 208/208]
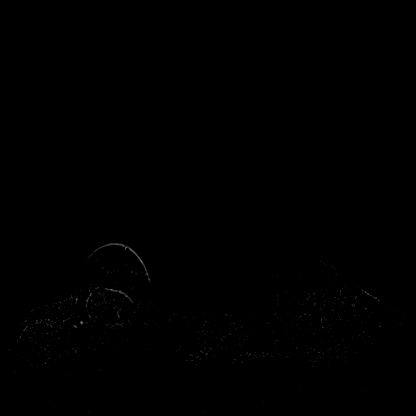

[Series 7: fl3d post-cm 20 · axial · 187.2mm · 0.82mm/px · 1 of 1 slices shown (3 of 3)]
[im 1/1]
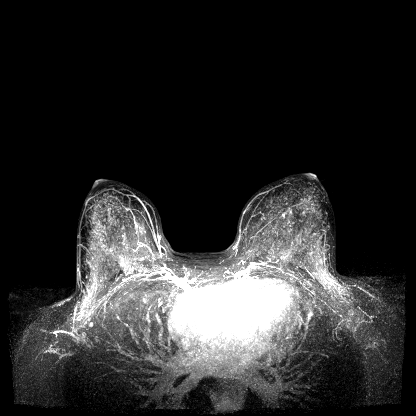

[Series 8: fl3d post-cm 3min · axial · 0.9mm · 0.82mm/px · z∈[-97,+89]mm · 6 of 208 slices shown]
[im 1/208]
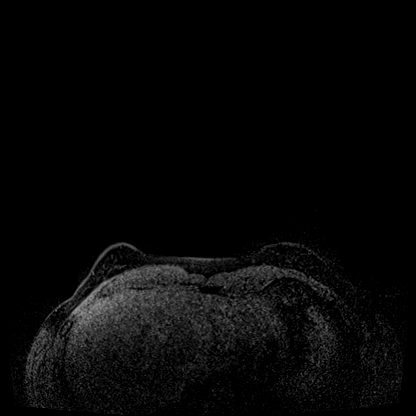
[im 42/208]
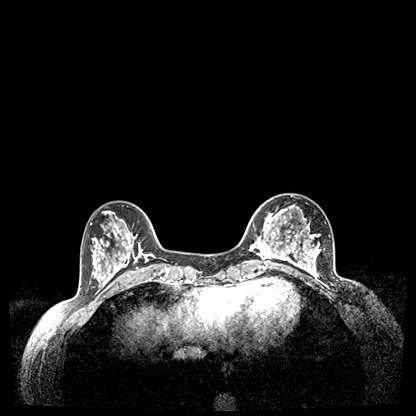
[im 83/208]
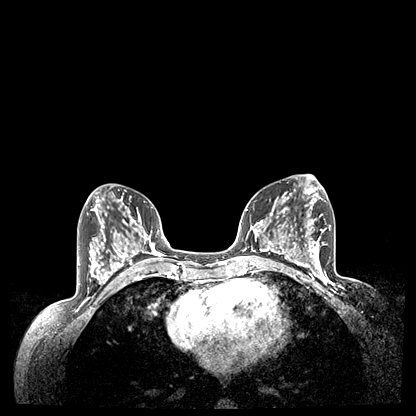
[im 125/208]
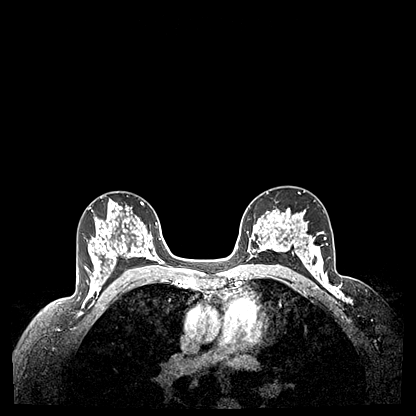
[im 166/208]
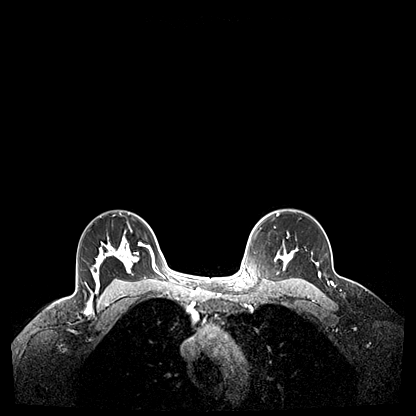
[im 208/208]
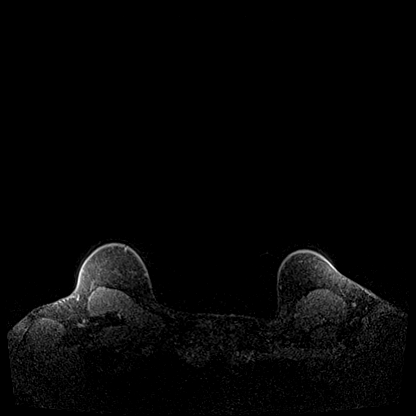

[Series 9: fl3d post-cm 3min_sub · axial · 0.9mm · 0.82mm/px · 1 of 208 slices shown]
[im 1/208]
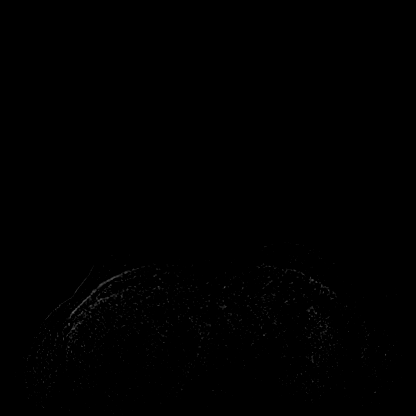

[29 of 48 positions shown; findings below may reference images not displayed]

Three-dimensional MR images were rendered by post-processing of the
original MR data on an independent workstation. The
three-dimensional MR images were interpreted, and findings are
reported in the following complete MRI report for this study. Three
dimensional images were evaluated at the independent interpreting
workstation using the [employer] thin client.
FINDINGS: Breast composition: d. Extreme fibroglandular tissue.

Background parenchymal enhancement: Marked

Right breast: In the lower slightly outer right breast there is a
small enhancing mass measuring 0.7 x 0.3 x 0.3 cm (series 9, image
150). This demonstrates mixed kinetics. In the lower inner right
breast there is asymmetric non mass enhancement spanning
approximately 1.2 cm (series 9, image 138).

Left breast: No mass or abnormal enhancement.

Lymph nodes: No abnormal appearing lymph nodes.

Ancillary findings:  None.
IMPRESSION: 1. Indeterminate enhancing mass in the lower slightly outer RIGHT
breast measuring 0.7 cm.
2. Asymmetric non mass enhancement in the lower inner RIGHT breast
spanning approximately 1.2 cm.
3. No MRI evidence of malignancy in the LEFT breast.

RECOMMENDATION:
MRI guided biopsy of the enhancing mass in the lower slightly outer
right breast and of the asymmetric non mass enhancement in the lower
inner right breast. (2 site)

BI-RADS CATEGORY  4: Suspicious.
